# Patient Record
Sex: Male | Born: 1980 | Smoking: Current every day smoker
Health system: Southern US, Community
[De-identification: ages and names within clinical notes are randomized; demographics above are authoritative.]

## PROBLEM LIST (undated history)

## (undated) HISTORY — PX: CARDIAC CATHETERIZATION: SHX172

---

## 2016-08-14 ENCOUNTER — Emergency Department (HOSPITAL_COMMUNITY)
Admission: EM | Admit: 2016-08-14 | Discharge: 2016-08-14 | Disposition: A | Discharge status: Home / Self Care | Payer: Self-pay | Attending: Emergency Medicine | Admitting: Emergency Medicine

## 2016-08-14 ENCOUNTER — Emergency Department (HOSPITAL_COMMUNITY): Payer: Self-pay

## 2016-08-14 ENCOUNTER — Encounter (HOSPITAL_COMMUNITY): Payer: Self-pay | Admitting: Emergency Medicine

## 2016-08-14 DIAGNOSIS — Z5321 Procedure and treatment not carried out due to patient leaving prior to being seen by health care provider: Secondary | ICD-10-CM | POA: Insufficient documentation

## 2016-08-14 DIAGNOSIS — R109 Unspecified abdominal pain: Secondary | ICD-10-CM | POA: Insufficient documentation

## 2016-08-14 DIAGNOSIS — K5900 Constipation, unspecified: Secondary | ICD-10-CM | POA: Insufficient documentation

## 2016-08-14 LAB — BASIC METABOLIC PANEL
Anion gap: 6 (ref 5–15)
BUN: 24 mg/dL — AB (ref 6–20)
CO2: 27 mmol/L (ref 22–32)
Calcium: 9.3 mg/dL (ref 8.9–10.3)
Chloride: 106 mmol/L (ref 101–111)
Creatinine, Ser: 1.09 mg/dL (ref 0.61–1.24)
GFR calc Af Amer: >60 mL/min (ref >60)
GLUCOSE: 99 mg/dL (ref 65–99)
Potassium: 4.2 mmol/L (ref 3.5–5.1)
Sodium: 139 mmol/L (ref 135–145)

## 2016-08-14 LAB — I-STAT TROPONIN, ED: Troponin i, poc: 0 ng/mL (ref 0.00–0.08)

## 2016-08-14 LAB — CBC
HCT: 41.4 % (ref 39.0–52.0)
Hemoglobin: 14.6 g/dL (ref 13.0–17.0)
MCH: 30.8 pg (ref 26.0–34.0)
MCHC: 35.3 g/dL (ref 30.0–36.0)
MCV: 87.3 fL (ref 78.0–100.0)
Platelets: 189 10*3/uL (ref 150–400)
RBC: 4.74 MIL/uL (ref 4.22–5.81)
RDW: 12.7 % (ref 11.5–15.5)
WBC: 6.7 10*3/uL (ref 4.0–10.5)

## 2016-08-14 LAB — LIPASE, BLOOD: Lipase: 29 U/L (ref 11–51)

## 2016-08-14 NOTE — ED Notes (Signed)
Patient called for blood draw, no answer. 

## 2016-08-14 NOTE — ED Notes (Signed)
Pt called again from triage with no answer 

## 2016-08-14 NOTE — ED Triage Notes (Signed)
Patient states abd pain and gas without BM x couple days. patient states that he has pain in left shoulder and chest and feels like heart is racing x 2 days.  Patient reports vomiting.

## 2016-08-14 NOTE — ED Notes (Signed)
No response when called for room 

## 2017-08-16 IMAGING — CR DG CHEST 2V
2 series · 2 of 2 positions shown · non-contrast
Comparison: None.

CLINICAL DATA: Patient states abd pain and gas without BM x 8 days.
patient states that he has pain in left shoulder with numbness and
chest and feels like heart is racing x 2 days. Patient reports
vomiting. Smokes 2 PPD. Not taking HTN meds. Not diabetic.

EXAM:
CHEST  2 VIEW

[w chest pa]
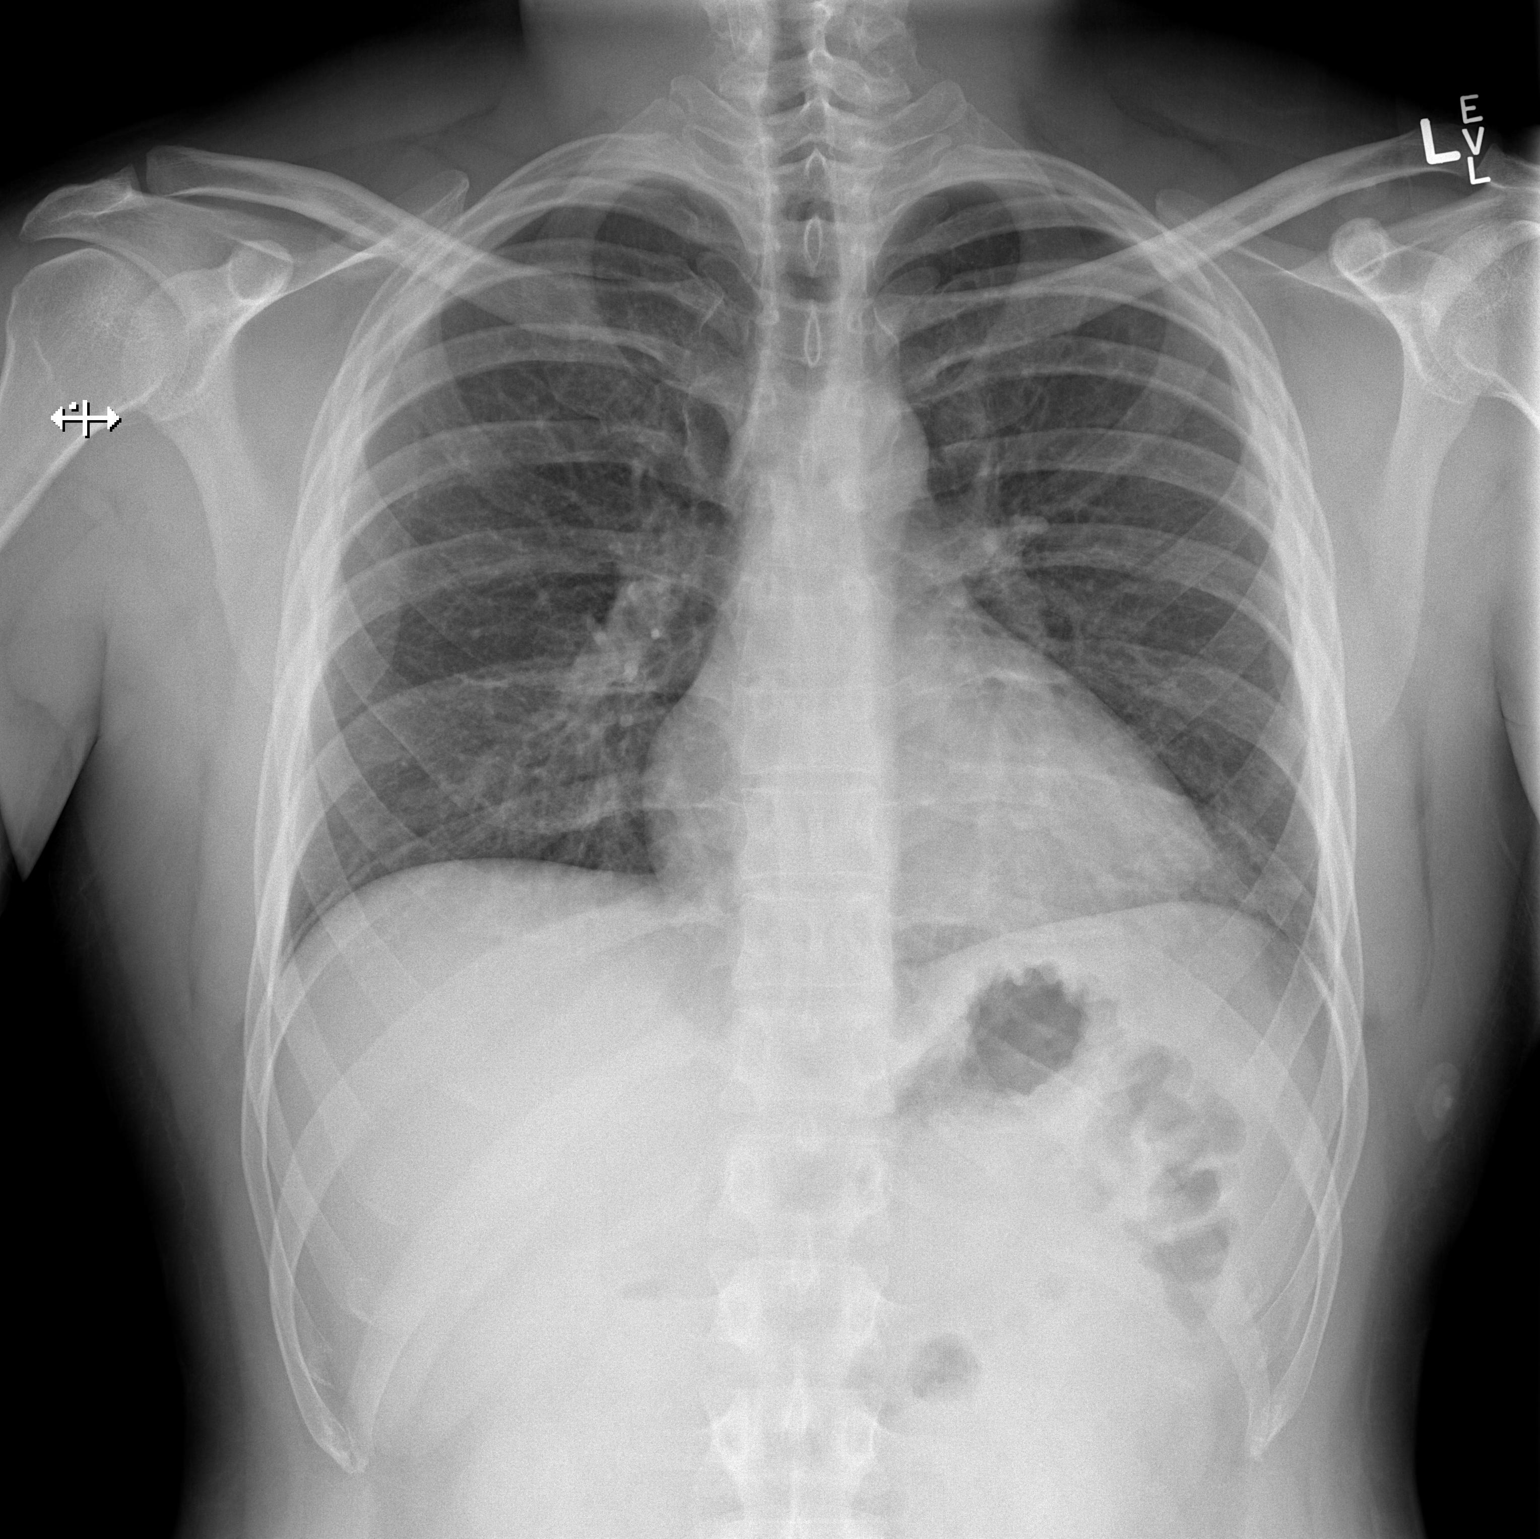

[w chest lat]
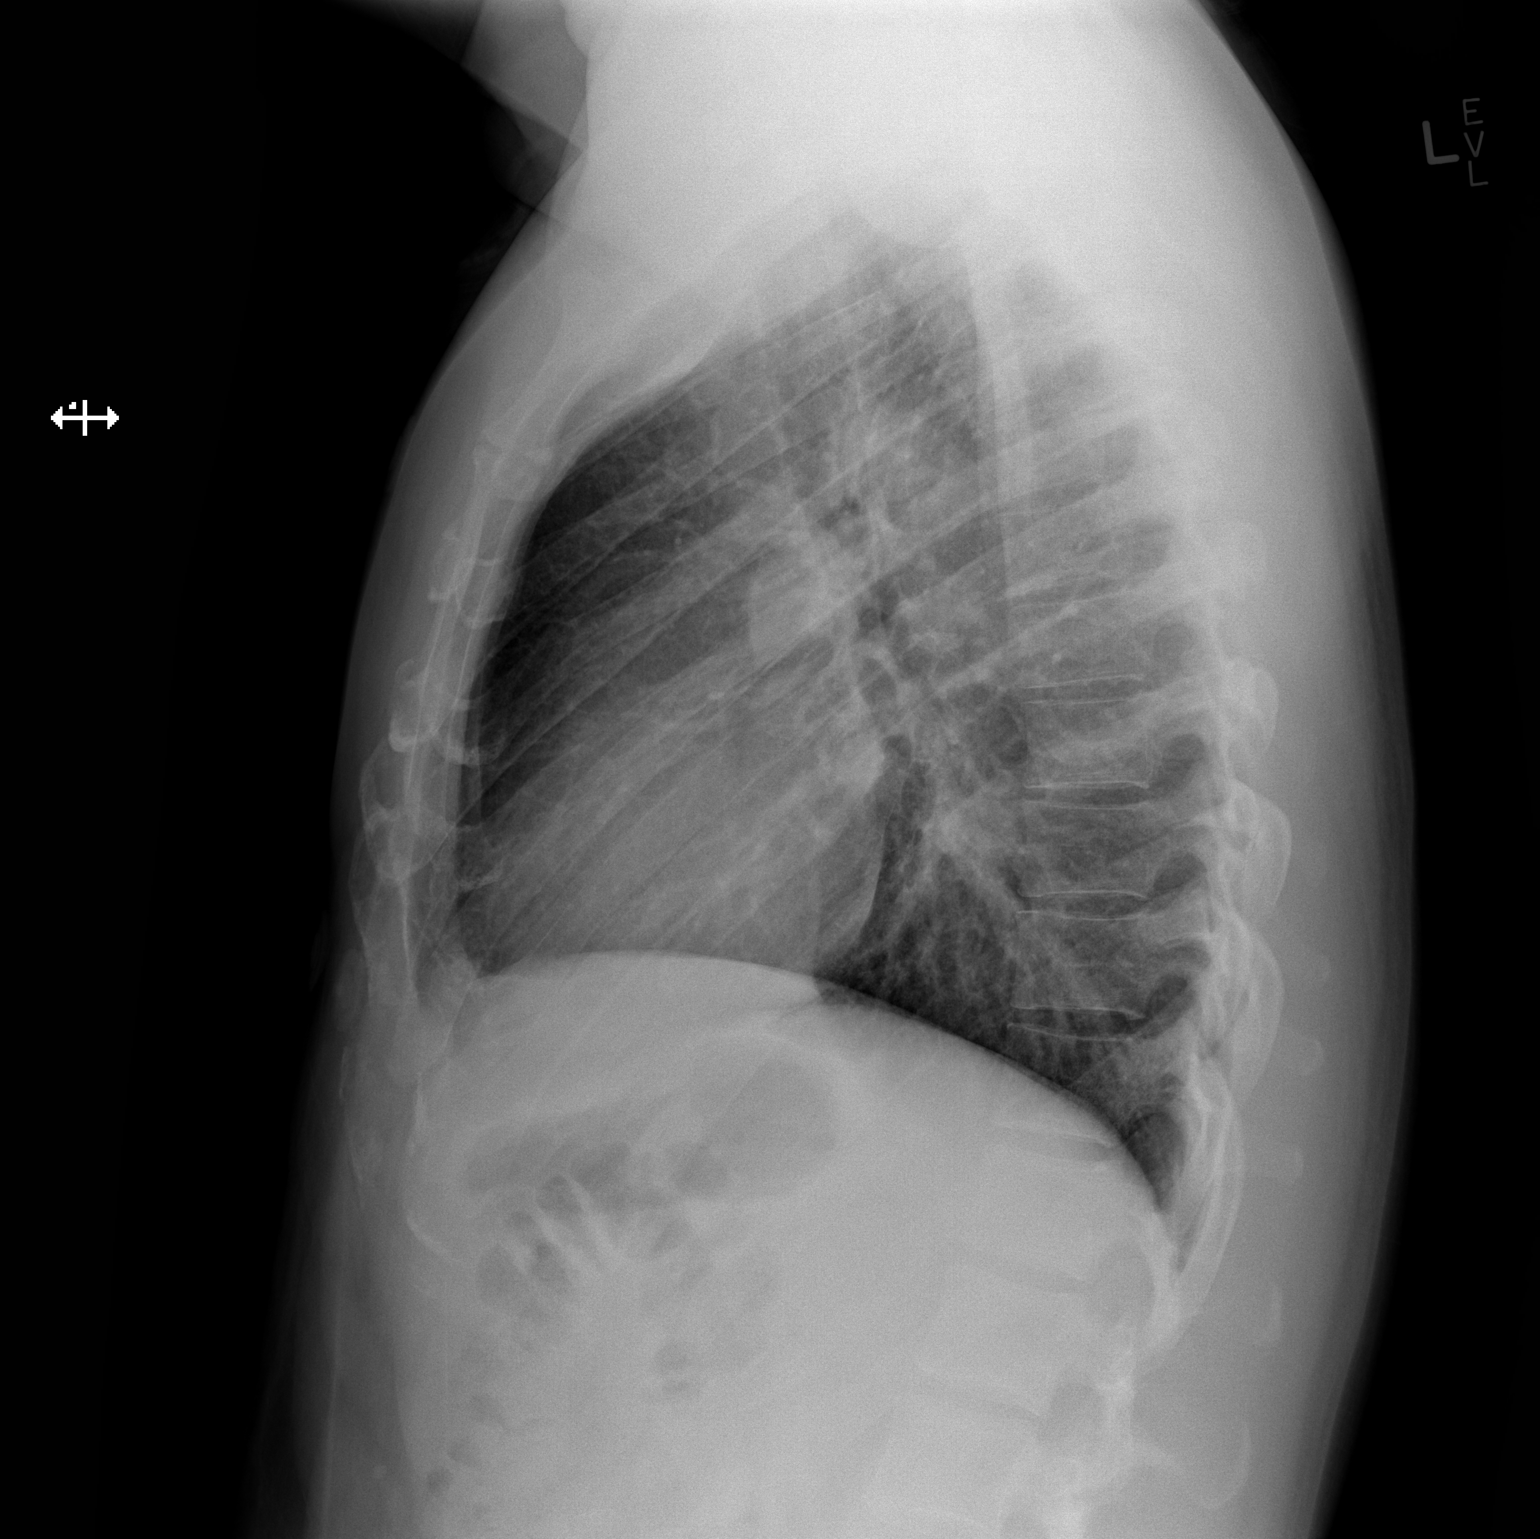

[2 of 2 positions shown; findings below may reference images not displayed]

FINDINGS: The heart size and mediastinal contours are within normal limits.
Both lungs are clear. The visualized skeletal structures are
unremarkable.
IMPRESSION: No active cardiopulmonary disease.
